# Patient Record
Sex: Female | Born: 1965 | Race: White | Hispanic: No | Marital: Married | Smoking: Never smoker
Health system: Southern US, Community
[De-identification: ages and names within clinical notes are randomized; demographics above are authoritative.]

## PROBLEM LIST (undated history)

## (undated) DIAGNOSIS — B373 Candidiasis of vulva and vagina: Secondary | ICD-10-CM

## (undated) DIAGNOSIS — R9430 Abnormal result of cardiovascular function study, unspecified: Secondary | ICD-10-CM

## (undated) DIAGNOSIS — M545 Low back pain, unspecified: Secondary | ICD-10-CM

## (undated) DIAGNOSIS — Z87440 Personal history of urinary (tract) infections: Secondary | ICD-10-CM

## (undated) DIAGNOSIS — Z8709 Personal history of other diseases of the respiratory system: Secondary | ICD-10-CM

## (undated) DIAGNOSIS — D219 Benign neoplasm of connective and other soft tissue, unspecified: Secondary | ICD-10-CM

## (undated) DIAGNOSIS — I341 Nonrheumatic mitral (valve) prolapse: Secondary | ICD-10-CM

## (undated) DIAGNOSIS — F329 Major depressive disorder, single episode, unspecified: Secondary | ICD-10-CM

## (undated) DIAGNOSIS — IMO0002 Reserved for concepts with insufficient information to code with codable children: Secondary | ICD-10-CM

## (undated) DIAGNOSIS — Z8619 Personal history of other infectious and parasitic diseases: Secondary | ICD-10-CM

## (undated) DIAGNOSIS — J189 Pneumonia, unspecified organism: Secondary | ICD-10-CM

## (undated) DIAGNOSIS — R51 Headache: Secondary | ICD-10-CM

## (undated) DIAGNOSIS — M1990 Unspecified osteoarthritis, unspecified site: Secondary | ICD-10-CM

## (undated) DIAGNOSIS — F32A Depression, unspecified: Secondary | ICD-10-CM

## (undated) DIAGNOSIS — G8929 Other chronic pain: Secondary | ICD-10-CM

## (undated) DIAGNOSIS — J329 Chronic sinusitis, unspecified: Secondary | ICD-10-CM

## (undated) DIAGNOSIS — R42 Dizziness and giddiness: Secondary | ICD-10-CM

## (undated) DIAGNOSIS — Z8669 Personal history of other diseases of the nervous system and sense organs: Secondary | ICD-10-CM

## (undated) DIAGNOSIS — B009 Herpesviral infection, unspecified: Secondary | ICD-10-CM

## (undated) DIAGNOSIS — R0602 Shortness of breath: Secondary | ICD-10-CM

## (undated) DIAGNOSIS — K219 Gastro-esophageal reflux disease without esophagitis: Secondary | ICD-10-CM

## (undated) DIAGNOSIS — M2550 Pain in unspecified joint: Secondary | ICD-10-CM

## (undated) DIAGNOSIS — M419 Scoliosis, unspecified: Secondary | ICD-10-CM

## (undated) DIAGNOSIS — Z8719 Personal history of other diseases of the digestive system: Secondary | ICD-10-CM

## (undated) HISTORY — PX: TOOTH EXTRACTION: SUR596

## (undated) HISTORY — DX: Candidiasis of vulva and vagina: B37.3

## (undated) HISTORY — PX: TONSILLECTOMY: SUR1361

## (undated) HISTORY — DX: Benign neoplasm of connective and other soft tissue, unspecified: D21.9

## (undated) HISTORY — PX: ESOPHAGOGASTRODUODENOSCOPY: SHX1529

## (undated) HISTORY — DX: Nonrheumatic mitral (valve) prolapse: I34.1

## (undated) HISTORY — DX: Personal history of urinary (tract) infections: Z87.440

## (undated) HISTORY — PX: BACK SURGERY: SHX140

## (undated) HISTORY — DX: Reserved for concepts with insufficient information to code with codable children: IMO0002

## (undated) HISTORY — DX: Abnormal result of cardiovascular function study, unspecified: R94.30

## (undated) HISTORY — DX: Headache: R51

## (undated) HISTORY — DX: Herpesviral infection, unspecified: B00.9

## (undated) HISTORY — DX: Scoliosis, unspecified: M41.9

---

## 1989-09-05 HISTORY — PX: TUBAL LIGATION: SHX77

## 1998-04-29 ENCOUNTER — Emergency Department (HOSPITAL_COMMUNITY): Admission: EM | Admit: 1998-04-29 | Discharge: 1998-04-29 | Payer: Self-pay | Admitting: Emergency Medicine

## 1998-06-14 ENCOUNTER — Emergency Department (HOSPITAL_COMMUNITY): Admission: EM | Admit: 1998-06-14 | Discharge: 1998-06-14 | Payer: Self-pay | Admitting: Internal Medicine

## 1998-07-15 ENCOUNTER — Emergency Department (HOSPITAL_COMMUNITY): Admission: EM | Admit: 1998-07-15 | Discharge: 1998-07-15 | Payer: Self-pay | Admitting: Internal Medicine

## 1998-10-01 ENCOUNTER — Emergency Department (HOSPITAL_COMMUNITY): Admission: EM | Admit: 1998-10-01 | Discharge: 1998-10-01 | Payer: Self-pay | Admitting: Emergency Medicine

## 1998-10-03 ENCOUNTER — Emergency Department (HOSPITAL_COMMUNITY): Admission: EM | Admit: 1998-10-03 | Discharge: 1998-10-03 | Payer: Self-pay | Admitting: Emergency Medicine

## 1999-03-15 ENCOUNTER — Encounter: Payer: Self-pay | Admitting: Emergency Medicine

## 1999-03-15 ENCOUNTER — Emergency Department (HOSPITAL_COMMUNITY): Admission: EM | Admit: 1999-03-15 | Discharge: 1999-03-15 | Payer: Self-pay | Admitting: Emergency Medicine

## 1999-07-06 ENCOUNTER — Other Ambulatory Visit: Admission: RE | Admit: 1999-07-06 | Discharge: 1999-07-06 | Payer: Self-pay | Admitting: Obstetrics and Gynecology

## 2000-09-04 ENCOUNTER — Other Ambulatory Visit: Admission: RE | Admit: 2000-09-04 | Discharge: 2000-09-04 | Payer: Self-pay | Admitting: Obstetrics and Gynecology

## 2001-05-01 ENCOUNTER — Emergency Department (HOSPITAL_COMMUNITY): Admission: EM | Admit: 2001-05-01 | Discharge: 2001-05-01 | Payer: Self-pay | Admitting: Emergency Medicine

## 2002-07-17 ENCOUNTER — Other Ambulatory Visit: Admission: RE | Admit: 2002-07-17 | Discharge: 2002-07-17 | Payer: Self-pay | Admitting: Obstetrics and Gynecology

## 2003-08-13 ENCOUNTER — Other Ambulatory Visit: Admission: RE | Admit: 2003-08-13 | Discharge: 2003-08-13 | Payer: Self-pay | Admitting: Obstetrics and Gynecology

## 2010-09-13 ENCOUNTER — Ambulatory Visit (HOSPITAL_COMMUNITY)
Admission: RE | Admit: 2010-09-13 | Discharge: 2010-09-13 | Payer: Self-pay | Source: Home / Self Care | Attending: Gastroenterology | Admitting: Gastroenterology

## 2010-10-06 ENCOUNTER — Other Ambulatory Visit (HOSPITAL_COMMUNITY)
Admission: RE | Admit: 2010-10-06 | Payer: BC Managed Care – HMO | Source: Ambulatory Visit | Admitting: Gastroenterology

## 2010-10-06 ENCOUNTER — Other Ambulatory Visit (HOSPITAL_COMMUNITY)
Admission: RE | Admit: 2010-10-06 | Discharge: 2010-10-06 | Disposition: A | Discharge status: Home / Self Care | Payer: BC Managed Care – HMO | Source: Ambulatory Visit | Attending: Gastroenterology | Admitting: Gastroenterology

## 2010-10-06 ENCOUNTER — Other Ambulatory Visit: Payer: Self-pay | Admitting: Gastroenterology

## 2010-10-06 DIAGNOSIS — B379 Candidiasis, unspecified: Secondary | ICD-10-CM | POA: Insufficient documentation

## 2010-11-05 ENCOUNTER — Other Ambulatory Visit: Payer: Self-pay | Admitting: Orthopedic Surgery

## 2010-11-05 DIAGNOSIS — M542 Cervicalgia: Secondary | ICD-10-CM

## 2010-11-06 ENCOUNTER — Ambulatory Visit
Admission: RE | Admit: 2010-11-06 | Discharge: 2010-11-06 | Disposition: A | Discharge status: Home / Self Care | Payer: BC Managed Care – HMO | Source: Ambulatory Visit | Attending: Orthopedic Surgery | Admitting: Orthopedic Surgery

## 2010-11-06 DIAGNOSIS — M542 Cervicalgia: Secondary | ICD-10-CM

## 2011-02-23 ENCOUNTER — Ambulatory Visit
Admission: RE | Admit: 2011-02-23 | Discharge: 2011-02-23 | Disposition: A | Discharge status: Home / Self Care | Payer: BC Managed Care – HMO | Source: Ambulatory Visit | Attending: Family Medicine | Admitting: Family Medicine

## 2011-02-23 ENCOUNTER — Other Ambulatory Visit: Payer: Self-pay | Admitting: Family Medicine

## 2011-02-23 MED ORDER — IOHEXOL 300 MG/ML  SOLN
100.0000 mL | Freq: Once | INTRAMUSCULAR | Status: AC | PRN
  Administered 2011-02-23: 100 mL via INTRAVENOUS

## 2012-10-12 ENCOUNTER — Other Ambulatory Visit: Payer: Self-pay | Admitting: Obstetrics and Gynecology

## 2012-10-12 ENCOUNTER — Telehealth: Payer: Self-pay | Admitting: Obstetrics and Gynecology

## 2012-10-12 MED ORDER — VALACYCLOVIR HCL 500 MG PO TABS: ORAL_TABLET | ORAL | Status: DC

## 2012-10-12 NOTE — Telephone Encounter (Signed)
Tc to pt regarding msg.  Lm on vm to call back. 

## 2012-10-16 ENCOUNTER — Encounter: Payer: Self-pay | Admitting: Obstetrics and Gynecology

## 2012-10-19 ENCOUNTER — Encounter: Payer: Self-pay | Admitting: Obstetrics and Gynecology

## 2012-10-19 ENCOUNTER — Ambulatory Visit: Payer: PRIVATE HEALTH INSURANCE | Admitting: Obstetrics and Gynecology

## 2012-10-19 VITALS — BP 110/70 | HR 78 | Ht 64.0 in | Wt 175.0 lb

## 2012-10-19 DIAGNOSIS — Z124 Encounter for screening for malignant neoplasm of cervix: Secondary | ICD-10-CM

## 2012-10-19 DIAGNOSIS — Z01419 Encounter for gynecological examination (general) (routine) without abnormal findings: Secondary | ICD-10-CM

## 2012-10-19 MED ORDER — VALACYCLOVIR HCL 500 MG PO TABS: ORAL_TABLET | ORAL | Status: DC

## 2012-10-19 NOTE — Progress Notes (Signed)
Subjective:    Charlotte Castillo is a 47 y.o. female, [id number], who presents for an annual exam. The patient reports more severe herpes outbreak recently, in a different location (upper perineal area, with flu-like symptoms). Recently was treated for bron chitis and sunus infection.  Menstrual cycle:   LMP: Patient's last menstrual period was 09/24/2012.             Review of Systems Pertinent items are noted in HPI. Denies pelvic pain, urinary tract symptoms, vaginitis symptoms, irregular bleeding, menopausal symptoms, change in bowel habits or rectal bleeding   Objective:    BP 110/70  Pulse 78  Ht 5\' 4"  (1.626 m)  Wt 175 lb (79.379 kg)  BMI 30.02 kg/m2  LMP 09/24/2012   Wt Readings from Last 1 Encounters:  10/19/12 175 lb (79.379 kg)   Body mass index is 30.02 kg/(m^2). General Appearance: Alert, no acute distress HEENT: Grossly normal Neck / Thyroid: Supple, no thyromegaly or cervical adenopathy Lungs: Clear to auscultation bilaterally Back: No CVA tenderness Breast Exam: No masses or nodes.No dimpling, nipple retraction or discharge. Cardiovascular: Regular rate and rhythm.  Gastrointestinal: Soft, non-tender, no masses or organomegaly Pelvic Exam: EGBUS-wnl, vagina-normal rugae, cervix- without lesions or tenderness, uterus appears normal size shape and consistency, adnexae-no masses or tenderness Rectovaginal: no masses and normal sphincter tone Lymphatic Exam: Non-palpable nodes in neck, clavicular,  axillary, or inguinal regions  Skin: no rashes or abnormalities Extremities: no clubbing cyanosis or edema  Neurologic: grossly normal Psychiatric: Alert and oriented    Assessment:   Routine GYN Exam HSV -2    Plan:    PAP sent  Valtrex 500 mg #36 1 po qd or bid x 3 days prn 11 refills  RTO 1 year or prn  Danarius Mcconathy,ELMIRAPA-C

## 2012-10-19 NOTE — Progress Notes (Signed)
Regular Periods: no pt have cycle changes; the days Mammogram: yes  Monthly Breast Ex.: yes Exercise: yes  Tetanus < 10 years: yes Seatbelts: yes  NI. Bladder Functn.: yes Abuse at home: no  Daily BM's: yes Stressful Work: yes  Healthy Diet: yes Sigmoid-Colonoscopy: no  Calcium: no Medical problems this year: pt states she had a really bad attack of herpes more than she ever had.   LAST PAP:6/12  Contraception: btl  Mammogram:  2012  PCP: Dr. Clarene Duke  PMH: no change  FMH: no change  Last Bone Scan: no  Pt is married.

## 2012-10-22 LAB — PAP IG W/ RFLX HPV ASCU

## 2012-11-05 NOTE — Telephone Encounter (Signed)
PT had apt with EP on 10/19/2012  Darien Ramus, CMA

## 2013-02-25 ENCOUNTER — Other Ambulatory Visit: Payer: Self-pay | Admitting: Neurosurgery

## 2013-03-01 ENCOUNTER — Encounter (HOSPITAL_COMMUNITY): Payer: Self-pay | Admitting: Pharmacy Technician

## 2013-03-12 ENCOUNTER — Encounter (HOSPITAL_COMMUNITY)
Admission: RE | Admit: 2013-03-12 | Discharge: 2013-03-12 | Disposition: A | Discharge status: Home / Self Care | Payer: PRIVATE HEALTH INSURANCE | Source: Ambulatory Visit | Attending: Neurosurgery | Admitting: Neurosurgery

## 2013-03-12 ENCOUNTER — Encounter (HOSPITAL_COMMUNITY): Payer: Self-pay

## 2013-03-12 HISTORY — DX: Nonrheumatic mitral (valve) prolapse: I34.1

## 2013-03-12 HISTORY — DX: Shortness of breath: R06.02

## 2013-03-12 HISTORY — DX: Depression, unspecified: F32.A

## 2013-03-12 HISTORY — DX: Low back pain, unspecified: G89.29

## 2013-03-12 HISTORY — DX: Pneumonia, unspecified organism: J18.9

## 2013-03-12 HISTORY — DX: Low back pain, unspecified: M54.50

## 2013-03-12 HISTORY — DX: Major depressive disorder, single episode, unspecified: F32.9

## 2013-03-12 HISTORY — DX: Personal history of other diseases of the nervous system and sense organs: Z86.69

## 2013-03-12 HISTORY — DX: Low back pain: M54.5

## 2013-03-12 HISTORY — DX: Personal history of other infectious and parasitic diseases: Z86.19

## 2013-03-12 HISTORY — DX: Personal history of urinary (tract) infections: Z87.440

## 2013-03-12 HISTORY — DX: Dizziness and giddiness: R42

## 2013-03-12 HISTORY — DX: Pain in unspecified joint: M25.50

## 2013-03-12 HISTORY — DX: Chronic sinusitis, unspecified: J32.9

## 2013-03-12 HISTORY — DX: Unspecified osteoarthritis, unspecified site: M19.90

## 2013-03-12 HISTORY — DX: Personal history of other diseases of the respiratory system: Z87.09

## 2013-03-12 HISTORY — DX: Personal history of other diseases of the digestive system: Z87.19

## 2013-03-12 HISTORY — DX: Gastro-esophageal reflux disease without esophagitis: K21.9

## 2013-03-12 LAB — BASIC METABOLIC PANEL
BUN: 13 mg/dL (ref 6–23)
Calcium: 9.3 mg/dL (ref 8.4–10.5)
Chloride: 102 mEq/L (ref 96–112)
Creatinine, Ser: 0.74 mg/dL (ref 0.50–1.10)
GFR calc Af Amer: >90 mL/min (ref >90)
GFR calc non Af Amer: >90 mL/min (ref >90)

## 2013-03-12 LAB — URINE MICROSCOPIC-ADD ON

## 2013-03-12 LAB — URINALYSIS, ROUTINE W REFLEX MICROSCOPIC
Bilirubin Urine: NEGATIVE
Ketones, ur: NEGATIVE mg/dL
Nitrite: NEGATIVE
Specific Gravity, Urine: 1.024 (ref 1.005–1.030)
Urobilinogen, UA: 0.2 mg/dL (ref 0.0–1.0)

## 2013-03-12 LAB — CBC WITH DIFFERENTIAL/PLATELET
Basophils Absolute: 0 10*3/uL (ref 0.0–0.1)
Basophils Relative: 1 % (ref 0–1)
Eosinophils Relative: 2 % (ref 0–5)
HCT: 41 % (ref 36.0–46.0)
MCHC: 34.1 g/dL (ref 30.0–36.0)
Monocytes Absolute: 0.7 10*3/uL (ref 0.1–1.0)
Neutro Abs: 3.1 10*3/uL (ref 1.7–7.7)
RDW: 12.8 % (ref 11.5–15.5)

## 2013-03-12 LAB — PROTIME-INR: INR: 0.97 (ref 0.00–1.49)

## 2013-03-12 NOTE — Progress Notes (Signed)
Pt doesn't have a cardiologist  Echo done early 90's  Denies ever having a stress test/heart cath  Dr.James Little is Medical Md   Denies EKG or CXR in past yr

## 2013-03-12 NOTE — Pre-Procedure Instructions (Signed)
MILLENA CALLINS  03/12/2013   Your procedure is scheduled on:  Tues, July 15 @ 7:30 AM  Report to [facility] at 5:30 AM.  Call this number if you have problems the morning of surgery: [phone]   Remember:   Do not eat food or drink liquids after midnight.   Take these medicines the morning of surgery with A SIP OF WATER: Wellbutrin(Bupropion),Pain Pill(if needed),and Phenergan(Promethazine-if needed)               Stop taking your Diclofenac.No Aspirin,Goody's,BC's,Aleve,Ibuprofen,Fish Oil,or any Herbal Medications   Do not wear jewelry, make-up or nail polish.  Do not wear lotions, powders, or perfumes. You may wear deodorant.  Do not shave 48 hours prior to surgery.   Do not bring valuables to the hospital.  [facility] is not responsible                   for any belongings or valuables.  Contacts, dentures or bridgework may not be worn into surgery.  Leave suitcase in the car. After surgery it may be brought to your room.  For patients admitted to the hospital, checkout time is 11:00 AM the day of  discharge.       Special Instructions: Shower using CHG 2 nights before surgery and the night before surgery.  If you shower the day of surgery use CHG.  Use special wash - you have one bottle of CHG for all showers.  You should use approximately 1/3 of the bottle for each shower.   Please read over the following fact sheets that you were given: Pain Booklet, Coughing and Deep Breathing, MRSA Information and Surgical Site Infection Prevention

## 2013-03-13 LAB — URINE CULTURE

## 2013-03-18 MED ORDER — VANCOMYCIN HCL IN DEXTROSE 1-5 GM/200ML-% IV SOLN
1000.0000 mg | INTRAVENOUS | Status: AC
  Administered 2013-03-19: 1000 mg via INTRAVENOUS
  Filled 2013-03-18: qty 200

## 2013-03-19 ENCOUNTER — Observation Stay (HOSPITAL_COMMUNITY)
Admission: RE | Admit: 2013-03-19 | Discharge: 2013-03-20 | Disposition: A | Discharge status: Home / Self Care | Payer: PRIVATE HEALTH INSURANCE | Source: Ambulatory Visit | Attending: Neurosurgery | Admitting: Neurosurgery

## 2013-03-19 ENCOUNTER — Inpatient Hospital Stay (HOSPITAL_COMMUNITY): Payer: PRIVATE HEALTH INSURANCE

## 2013-03-19 ENCOUNTER — Inpatient Hospital Stay (HOSPITAL_COMMUNITY): Payer: PRIVATE HEALTH INSURANCE | Admitting: Certified Registered"

## 2013-03-19 ENCOUNTER — Encounter (HOSPITAL_COMMUNITY): Payer: Self-pay | Admitting: Certified Registered"

## 2013-03-19 ENCOUNTER — Encounter (HOSPITAL_COMMUNITY): Payer: Self-pay | Admitting: *Deleted

## 2013-03-19 ENCOUNTER — Encounter (HOSPITAL_COMMUNITY)
Admission: RE | Disposition: A | Discharge status: Home / Self Care | Payer: Self-pay | Source: Ambulatory Visit | Attending: Neurosurgery

## 2013-03-19 DIAGNOSIS — M47812 Spondylosis without myelopathy or radiculopathy, cervical region: Secondary | ICD-10-CM | POA: Insufficient documentation

## 2013-03-19 DIAGNOSIS — M5020 Other cervical disc displacement, unspecified cervical region: Principal | ICD-10-CM | POA: Insufficient documentation

## 2013-03-19 DIAGNOSIS — Z01812 Encounter for preprocedural laboratory examination: Secondary | ICD-10-CM | POA: Insufficient documentation

## 2013-03-19 HISTORY — PX: ANTERIOR CERVICAL DECOMP/DISCECTOMY FUSION: SHX1161

## 2013-03-19 SURGERY — ANTERIOR CERVICAL DECOMPRESSION/DISCECTOMY FUSION 3 LEVELS
Anesthesia: General | Site: Spine Cervical | Wound class: Clean

## 2013-03-19 MED ORDER — MENTHOL 3 MG MT LOZG: 1.0000 | LOZENGE | OROMUCOSAL | Status: DC | PRN

## 2013-03-19 MED ORDER — METHOCARBAMOL 500 MG PO TABS
500.0000 mg | ORAL_TABLET | Freq: Four times a day (QID) | ORAL | Status: DC | PRN
  Administered 2013-03-19: 500 mg via ORAL

## 2013-03-19 MED ORDER — MUPIROCIN 2 % EX OINT
1.0000 "application " | TOPICAL_OINTMENT | Freq: Two times a day (BID) | CUTANEOUS | Status: DC
  Administered 2013-03-19: 1 via NASAL
  Filled 2013-03-19: qty 22

## 2013-03-19 MED ORDER — MAGNESIUM HYDROXIDE 400 MG/5ML PO SUSP: 30.0000 mL | Freq: Every day | ORAL | Status: DC | PRN

## 2013-03-19 MED ORDER — BUPROPION HCL ER (SR) 100 MG PO TB12
100.0000 mg | ORAL_TABLET | Freq: Every day | ORAL | Status: DC
  Administered 2013-03-19: 100 mg via ORAL
  Filled 2013-03-19 (×2): qty 1

## 2013-03-19 MED ORDER — LIDOCAINE HCL (CARDIAC) 20 MG/ML IV SOLN
INTRAVENOUS | Status: DC | PRN
  Administered 2013-03-19: 100 mg via INTRAVENOUS

## 2013-03-19 MED ORDER — LIDOCAINE-EPINEPHRINE 1 %-1:100000 IJ SOLN
INTRAMUSCULAR | Status: DC | PRN
  Administered 2013-03-19: 10 mL

## 2013-03-19 MED ORDER — FENTANYL CITRATE 0.05 MG/ML IJ SOLN
INTRAMUSCULAR | Status: DC | PRN
  Administered 2013-03-19: 100 ug via INTRAVENOUS
  Administered 2013-03-19 (×2): 50 ug via INTRAVENOUS
  Administered 2013-03-19: 100 ug via INTRAVENOUS
  Administered 2013-03-19 (×2): 50 ug via INTRAVENOUS

## 2013-03-19 MED ORDER — PROMETHAZINE HCL 25 MG PO TABS: 25.0000 mg | ORAL_TABLET | Freq: Four times a day (QID) | ORAL | Status: DC | PRN

## 2013-03-19 MED ORDER — DOCUSATE SODIUM 100 MG PO CAPS
100.0000 mg | ORAL_CAPSULE | Freq: Two times a day (BID) | ORAL | Status: DC
  Administered 2013-03-19 (×2): 100 mg via ORAL
  Filled 2013-03-19 (×3): qty 1

## 2013-03-19 MED ORDER — ROCURONIUM BROMIDE 100 MG/10ML IV SOLN
INTRAVENOUS | Status: DC | PRN
  Administered 2013-03-19: 35 mg via INTRAVENOUS

## 2013-03-19 MED ORDER — METOCLOPRAMIDE HCL 5 MG/ML IJ SOLN
INTRAMUSCULAR | Status: DC | PRN
  Administered 2013-03-19: 5 mg via INTRAVENOUS

## 2013-03-19 MED ORDER — DEXAMETHASONE SODIUM PHOSPHATE 4 MG/ML IJ SOLN
INTRAMUSCULAR | Status: DC | PRN
  Administered 2013-03-19: 8 mg via INTRAVENOUS

## 2013-03-19 MED ORDER — ACETAMINOPHEN 325 MG PO TABS
650.0000 mg | ORAL_TABLET | ORAL | Status: DC | PRN
  Administered 2013-03-20: 650 mg via ORAL
  Filled 2013-03-19: qty 2

## 2013-03-19 MED ORDER — VECURONIUM BROMIDE 10 MG IV SOLR
INTRAVENOUS | Status: DC | PRN
  Administered 2013-03-19: 2 mg via INTRAVENOUS
  Administered 2013-03-19: 1 mg via INTRAVENOUS
  Administered 2013-03-19: 2 mg via INTRAVENOUS

## 2013-03-19 MED ORDER — LACTATED RINGERS IV SOLN: INTRAVENOUS | Status: DC

## 2013-03-19 MED ORDER — BISACODYL 10 MG RE SUPP: 10.0000 mg | Freq: Every day | RECTAL | Status: DC | PRN

## 2013-03-19 MED ORDER — ARTIFICIAL TEARS OP OINT
TOPICAL_OINTMENT | OPHTHALMIC | Status: DC | PRN
  Administered 2013-03-19: 1 via OPHTHALMIC

## 2013-03-19 MED ORDER — ONDANSETRON HCL 4 MG/2ML IJ SOLN: 4.0000 mg | Freq: Once | INTRAMUSCULAR | Status: DC | PRN

## 2013-03-19 MED ORDER — SODIUM CHLORIDE 0.9 % IV SOLN: 250.0000 mL | INTRAVENOUS | Status: DC

## 2013-03-19 MED ORDER — OXYCODONE-ACETAMINOPHEN 5-325 MG PO TABS: 1.0000 | ORAL_TABLET | ORAL | Status: DC | PRN

## 2013-03-19 MED ORDER — ISOPROPYL ALCOHOL 70 % SOLN
Status: DC | PRN
  Administered 2013-03-19: 1 via TOPICAL

## 2013-03-19 MED ORDER — PHENYLEPHRINE HCL 10 MG/ML IJ SOLN
INTRAMUSCULAR | Status: DC | PRN
  Administered 2013-03-19 (×4): 40 ug via INTRAVENOUS

## 2013-03-19 MED ORDER — TRAMADOL HCL 50 MG PO TABS
50.0000 mg | ORAL_TABLET | Freq: Four times a day (QID) | ORAL | Status: DC | PRN
  Filled 2013-03-19: qty 1

## 2013-03-19 MED ORDER — 0.9 % SODIUM CHLORIDE (POUR BTL) OPTIME
TOPICAL | Status: DC | PRN
  Administered 2013-03-19: 1000 mL

## 2013-03-19 MED ORDER — METHOCARBAMOL 100 MG/ML IJ SOLN
500.0000 mg | Freq: Four times a day (QID) | INTRAVENOUS | Status: DC | PRN
  Filled 2013-03-19: qty 5

## 2013-03-19 MED ORDER — DEXTROSE 5 % IV SOLN
INTRAVENOUS | Status: DC | PRN
  Administered 2013-03-19: 08:00:00 via INTRAVENOUS

## 2013-03-19 MED ORDER — THROMBIN 20000 UNITS EX SOLR
CUTANEOUS | Status: DC | PRN
  Administered 2013-03-19: 09:00:00 via TOPICAL

## 2013-03-19 MED ORDER — METHOCARBAMOL 500 MG PO TABS
ORAL_TABLET | ORAL | Status: AC
  Filled 2013-03-19: qty 1

## 2013-03-19 MED ORDER — ACETAMINOPHEN 650 MG RE SUPP: 650.0000 mg | RECTAL | Status: DC | PRN

## 2013-03-19 MED ORDER — LIDOCAINE HCL 4 % MT SOLN
OROMUCOSAL | Status: DC | PRN
  Administered 2013-03-19: 4 mL via TOPICAL

## 2013-03-19 MED ORDER — SCOPOLAMINE 1 MG/3DAYS TD PT72
MEDICATED_PATCH | TRANSDERMAL | Status: AC
  Administered 2013-03-19: 1 via TRANSDERMAL
  Filled 2013-03-19: qty 1

## 2013-03-19 MED ORDER — SODIUM CHLORIDE 0.9 % IJ SOLN: 3.0000 mL | INTRAMUSCULAR | Status: DC | PRN

## 2013-03-19 MED ORDER — VANCOMYCIN HCL IN DEXTROSE 1-5 GM/200ML-% IV SOLN
1000.0000 mg | Freq: Once | INTRAVENOUS | Status: AC
  Administered 2013-03-19: 1000 mg via INTRAVENOUS
  Filled 2013-03-19: qty 200

## 2013-03-19 MED ORDER — BACITRACIN 50000 UNITS IM SOLR
INTRAMUSCULAR | Status: AC
  Filled 2013-03-19: qty 1

## 2013-03-19 MED ORDER — PROPOFOL 10 MG/ML IV BOLUS
INTRAVENOUS | Status: DC | PRN
  Administered 2013-03-19: 200 mg via INTRAVENOUS

## 2013-03-19 MED ORDER — ALBUMIN HUMAN 5 % IV SOLN
INTRAVENOUS | Status: DC | PRN
  Administered 2013-03-19 (×2): via INTRAVENOUS

## 2013-03-19 MED ORDER — ZOLPIDEM TARTRATE 5 MG PO TABS: 5.0000 mg | ORAL_TABLET | Freq: Every evening | ORAL | Status: DC | PRN

## 2013-03-19 MED ORDER — KETOROLAC TROMETHAMINE 30 MG/ML IJ SOLN
30.0000 mg | Freq: Four times a day (QID) | INTRAMUSCULAR | Status: DC
  Administered 2013-03-19 – 2013-03-20 (×3): 30 mg via INTRAVENOUS
  Filled 2013-03-19 (×4): qty 1

## 2013-03-19 MED ORDER — CYCLOBENZAPRINE HCL 10 MG PO TABS: 10.0000 mg | ORAL_TABLET | Freq: Three times a day (TID) | ORAL | Status: DC | PRN

## 2013-03-19 MED ORDER — PROMETHAZINE HCL 25 MG PO TABS: 12.5000 mg | ORAL_TABLET | ORAL | Status: DC | PRN

## 2013-03-19 MED ORDER — NEOSTIGMINE METHYLSULFATE 1 MG/ML IJ SOLN
INTRAMUSCULAR | Status: DC | PRN
  Administered 2013-03-19: 4 mg via INTRAVENOUS

## 2013-03-19 MED ORDER — VALACYCLOVIR HCL 500 MG PO TABS
500.0000 mg | ORAL_TABLET | Freq: Two times a day (BID) | ORAL | Status: DC
  Administered 2013-03-19 (×2): 500 mg via ORAL
  Filled 2013-03-19 (×4): qty 1

## 2013-03-19 MED ORDER — SODIUM CHLORIDE 0.9 % IJ SOLN
3.0000 mL | Freq: Two times a day (BID) | INTRAMUSCULAR | Status: DC
  Administered 2013-03-19: 3 mL via INTRAVENOUS

## 2013-03-19 MED ORDER — LACTATED RINGERS IV SOLN
INTRAVENOUS | Status: DC | PRN
  Administered 2013-03-19 (×2): via INTRAVENOUS

## 2013-03-19 MED ORDER — MIDAZOLAM HCL 5 MG/5ML IJ SOLN
INTRAMUSCULAR | Status: DC | PRN
  Administered 2013-03-19 (×2): 1 mg via INTRAVENOUS

## 2013-03-19 MED ORDER — HYDROCODONE-ACETAMINOPHEN 5-325 MG PO TABS: 1.0000 | ORAL_TABLET | ORAL | Status: DC | PRN

## 2013-03-19 MED ORDER — SODIUM CHLORIDE 0.9 % IV SOLN
INTRAVENOUS | Status: AC
  Filled 2013-03-19: qty 500

## 2013-03-19 MED ORDER — PROMETHAZINE HCL 25 MG/ML IJ SOLN: 12.5000 mg | INTRAMUSCULAR | Status: DC | PRN

## 2013-03-19 MED ORDER — PHENOL 1.4 % MT LIQD: 1.0000 | OROMUCOSAL | Status: DC | PRN

## 2013-03-19 MED ORDER — ONDANSETRON HCL 4 MG/2ML IJ SOLN
INTRAMUSCULAR | Status: DC | PRN
  Administered 2013-03-19 (×2): 4 mg via INTRAVENOUS

## 2013-03-19 MED ORDER — ONDANSETRON HCL 4 MG/2ML IJ SOLN: 4.0000 mg | INTRAMUSCULAR | Status: DC | PRN

## 2013-03-19 MED ORDER — HYDROMORPHONE HCL PF 1 MG/ML IJ SOLN
INTRAMUSCULAR | Status: AC
  Filled 2013-03-19: qty 1

## 2013-03-19 MED ORDER — GLYCOPYRROLATE 0.2 MG/ML IJ SOLN
INTRAMUSCULAR | Status: DC | PRN
  Administered 2013-03-19: 0.6 mg via INTRAVENOUS

## 2013-03-19 MED ORDER — SODIUM CHLORIDE 0.9 % IR SOLN
Status: DC | PRN
  Administered 2013-03-19: 09:00:00

## 2013-03-19 MED ORDER — MORPHINE SULFATE 2 MG/ML IJ SOLN: 1.0000 mg | INTRAMUSCULAR | Status: DC | PRN

## 2013-03-19 MED ORDER — CHLORHEXIDINE GLUCONATE CLOTH 2 % EX PADS
6.0000 | MEDICATED_PAD | Freq: Every day | CUTANEOUS | Status: DC
  Administered 2013-03-20: 6 via TOPICAL

## 2013-03-19 MED ORDER — HYDROMORPHONE HCL PF 1 MG/ML IJ SOLN
0.2500 mg | INTRAMUSCULAR | Status: DC | PRN
  Administered 2013-03-19 (×2): 0.5 mg via INTRAVENOUS

## 2013-03-19 SURGICAL SUPPLY — 54 items
APL SKNCLS STERI-STRIP NONHPOA (GAUZE/BANDAGES/DRESSINGS) ×1
BAG DECANTER FOR FLEXI CONT (MISCELLANEOUS) ×2 IMPLANT
BANDAGE GAUZE ELAST BULKY 4 IN (GAUZE/BANDAGES/DRESSINGS) ×4 IMPLANT
BENZOIN TINCTURE PRP APPL 2/3 (GAUZE/BANDAGES/DRESSINGS) ×2 IMPLANT
BIT DRILL 2.3 12 FIXED (INSTRUMENTS) IMPLANT
BUR MATCHSTICK NEURO 3.0 LAGG (BURR) ×2 IMPLANT
CANISTER SUCTION 2500CC (MISCELLANEOUS) ×2 IMPLANT
CLOTH BEACON ORANGE TIMEOUT ST (SAFETY) ×2 IMPLANT
CONT SPEC 4OZ CLIKSEAL STRL BL (MISCELLANEOUS) ×2 IMPLANT
DRAPE LAPAROTOMY 100X72 PEDS (DRAPES) ×2 IMPLANT
DRAPE MICROSCOPE LEICA (MISCELLANEOUS) ×2 IMPLANT
DRAPE POUCH INSTRU U-SHP 10X18 (DRAPES) ×2 IMPLANT
DRILL 12MM (INSTRUMENTS) ×2
DURAPREP 6ML APPLICATOR 50/CS (WOUND CARE) ×2 IMPLANT
ELECT REM PT RETURN 9FT ADLT (ELECTROSURGICAL) ×2
ELECTRODE REM PT RTRN 9FT ADLT (ELECTROSURGICAL) ×1 IMPLANT
GAUZE SPONGE 4X4 16PLY XRAY LF (GAUZE/BANDAGES/DRESSINGS) IMPLANT
GLOVE BIOGEL PI IND STRL 7.5 (GLOVE) IMPLANT
GLOVE BIOGEL PI INDICATOR 7.5 (GLOVE) ×2
GLOVE ECLIPSE 7.5 STRL STRAW (GLOVE) ×2 IMPLANT
GLOVE EXAM NITRILE LRG STRL (GLOVE) IMPLANT
GLOVE EXAM NITRILE MD LF STRL (GLOVE) IMPLANT
GLOVE EXAM NITRILE XL STR (GLOVE) IMPLANT
GLOVE EXAM NITRILE XS STR PU (GLOVE) IMPLANT
GLOVE SURG SS PI 7.0 STRL IVOR (GLOVE) ×4 IMPLANT
GOWN BRE IMP SLV AUR LG STRL (GOWN DISPOSABLE) ×1 IMPLANT
GOWN BRE IMP SLV AUR XL STRL (GOWN DISPOSABLE) ×1 IMPLANT
GOWN STRL REIN 2XL LVL4 (GOWN DISPOSABLE) IMPLANT
HEAD HALTER (SOFTGOODS) ×2 IMPLANT
KIT BASIN OR (CUSTOM PROCEDURE TRAY) ×2 IMPLANT
KIT ROOM TURNOVER OR (KITS) ×2 IMPLANT
NDL HYPO 25X1 1.5 SAFETY (NEEDLE) ×1 IMPLANT
NDL SPNL 20GX3.5 QUINCKE YW (NEEDLE) ×1 IMPLANT
NEEDLE HYPO 22GX1.5 SAFETY (NEEDLE) ×3 IMPLANT
NEEDLE HYPO 25X1 1.5 SAFETY (NEEDLE) ×2 IMPLANT
NEEDLE SPNL 20GX3.5 QUINCKE YW (NEEDLE) ×4 IMPLANT
NS IRRIG 1000ML POUR BTL (IV SOLUTION) ×2 IMPLANT
PACK LAMINECTOMY NEURO (CUSTOM PROCEDURE TRAY) ×2 IMPLANT
PATTIES SURGICAL .75X.75 (GAUZE/BANDAGES/DRESSINGS) ×2 IMPLANT
PIN DISTRACTION 14MM (PIN) ×4 IMPLANT
PLATE 48MM (Plate) ×1 IMPLANT
RUBBERBAND STERILE (MISCELLANEOUS) ×4 IMPLANT
SCREW 12MM (Screw) ×8 IMPLANT
SPACER ASSEM CERV LORD 6M (Spacer) ×3 IMPLANT
SPONGE GAUZE 4X4 12PLY (GAUZE/BANDAGES/DRESSINGS) ×2 IMPLANT
SPONGE INTESTINAL PEANUT (DISPOSABLE) ×2 IMPLANT
STRIP CLOSURE SKIN 1/2X4 (GAUZE/BANDAGES/DRESSINGS) ×2 IMPLANT
SUT VIC AB 3-0 SH 8-18 (SUTURE) ×3 IMPLANT
SYR 20ML ECCENTRIC (SYRINGE) ×2 IMPLANT
TAPE CLOTH SURG 4X10 WHT LF (GAUZE/BANDAGES/DRESSINGS) ×1 IMPLANT
TOWEL OR 17X24 6PK STRL BLUE (TOWEL DISPOSABLE) ×2 IMPLANT
TOWEL OR 17X26 10 PK STRL BLUE (TOWEL DISPOSABLE) ×2 IMPLANT
TRAY FOLEY CATH 14FRSI W/METER (CATHETERS) ×1 IMPLANT
WATER STERILE IRR 1000ML POUR (IV SOLUTION) ×2 IMPLANT

## 2013-03-19 NOTE — Anesthesia Procedure Notes (Signed)
Procedure Name: Intubation Date/Time: 03/19/2013 7:56 AM Performed by: Margaree Mackintosh Pre-anesthesia Checklist: Patient identified, Timeout performed, Emergency Drugs available, Suction available and Patient being monitored Patient Re-evaluated:Patient Re-evaluated prior to inductionOxygen Delivery Method: Circle system utilized Preoxygenation: Pre-oxygenation with 100% oxygen Intubation Type: IV induction Ventilation: Mask ventilation without difficulty and Oral airway inserted - appropriate to patient size Laryngoscope Size: Mac and 3 Grade View: Grade II Tube type: Oral Tube size: 7.0 mm Number of attempts: 1 Airway Equipment and Method: Stylet and LTA kit utilized Placement Confirmation: ETT inserted through vocal cords under direct vision,  positive ETCO2 and breath sounds checked- equal and bilateral Secured at: 22 cm Tube secured with: Tape Dental Injury: Teeth and Oropharynx as per pre-operative assessment

## 2013-03-19 NOTE — Progress Notes (Signed)
ANTIBIOTIC CONSULT NOTE - INITIAL  Pharmacy Consult for Vancomycin Indication: Surgical prophylaxis.  Allergies  Allergen Reactions  . Codeine     Nausea   . Demerol (Meperidine)     unknown  . Penicillins     Rash, nausea   . Tetracyclines & Related     Rash, nausea and vomiting  . Other     Yogurt-vomiting  . Prozac (Fluoxetine Hcl)     Sick     Patient Measurements:   Weight = 80.6 kg  Vital Signs: Temp: 99 F (37.2 C) (07/15 1240) Temp src: Oral (07/15 0624) BP: 128/65 mmHg (07/15 1240) Pulse Rate: 93 (07/15 1240) Intake/Output from previous day:   Intake/Output from this shift: Total I/O In: 2440 [P.O.:240; I.V.:1700; IV Piggyback:500] Out: 365 [Urine:215; Blood:150]  Labs: No results found for this basename: WBC, HGB, PLT, LABCREA, CREATININE,  in the last 72 hours The CrCl is unknown because both a height and weight (above a minimum accepted value) are required for this calculation. No results found for this basename: VANCOTROUGH, Leodis Binet, VANCORANDOM, GENTTROUGH, GENTPEAK, GENTRANDOM, TOBRATROUGH, TOBRAPEAK, TOBRARND, AMIKACINPEAK, AMIKACINTROU, AMIKACIN,  in the last 72 hours   Microbiology: Recent Results (from the past 720 hour(s))  SURGICAL PCR SCREEN     Status: Abnormal   Collection Time    03/12/13  8:57 AM      Result Value Range Status   MRSA, PCR POSITIVE (*) NEGATIVE Final   Staphylococcus aureus POSITIVE (*) NEGATIVE Final   Comment:            The Xpert SA Assay (FDA     approved for NASAL specimens     in patients over 47 years of age),     is one component of     a comprehensive surveillance     program.  Test performance has     been validated by [employer] for patients greater     than or equal to 47 year old.     It is not intended     to diagnose infection nor to     guide or monitor treatment.  URINE CULTURE     Status: None   Collection Time    03/12/13  8:58 AM      Result Value Range Status   Specimen  Description URINE, CLEAN CATCH   Final   Special Requests NONE   Final   Culture  Setup Time 03/12/2013 10:19   Final   Colony Count NO GROWTH   Final   Culture NO GROWTH   Final   Report Status 03/13/2013 FINAL   Final    Medical History: Past Medical History  Diagnosis Date  . Abnormal Pap smear     ACUS   . Fibroid   . HSV infection     takes Valtrex prn  . Vaginal yeast infection     h/o  . Headache(784.0)   . Mitral valve prolapse   . Hx of bladder infections   . Scoliosis   . H/O hiatal hernia   . MVP (mitral valve prolapse)     no issues   . Shortness of breath     occasionally and more with exertion  . Pneumonia     hx of;last time about 20yrs ago  . Sinus infection     hx of;early in 2014  . History of migraine     last one about 3months ago  . Dizziness  occasionally  . Arthritis   . Joint pain   . Chronic low back pain   . GERD (gastroesophageal reflux disease)     no meds required  . History of bronchitis     early 2014  . History of bladder infections     last time about a yr ago  . Depression     takes Wellbutrin daily  . History of shingles     a yr ago    Assessment: 47 YOF s/p anterior cervical decompression/discectomy fusion. Pharmacy is consulted to dose vancomycin for surgical prophylaxis d/t pt. Has penicillin allergy. 1 dose of vancomycin 1000 ng was given ~ 0800 this am. No drain in place per RN. Renal function good. Scr 0.74 with est. crcl > 90 ml/min.  Goal of Therapy:  Prevent post-op infections  Plan:  - Vancomycin 1000 mg IV x 1 at 2000 - Pharmacy sign off  Please reconsult if need help for other antibiotics dosing.   Bayard Hugger, PharmD, BCPS  Clinical Pharmacist  Pager: [phone]   03/19/2013,2:19 PM

## 2013-03-19 NOTE — Preoperative (Signed)
Beta Blockers   Reason not to administer Beta Blockers:Not Applicable 

## 2013-03-19 NOTE — Transfer of Care (Signed)
Immediate Anesthesia Transfer of Care Note  Patient: Charlotte Castillo  Procedure(s) Performed: Procedure(s) with comments: ANTERIOR CERVICAL DECOMPRESSION/DISCECTOMY FUSION 3 LEVELS (N/A) - Cervical Five-Six,Cervical Six-Seven,Cervical Seven-Thoracic One  Anterior cervical decompression/diskectomy/fusion/allograft/trestle plate  Patient Location: PACU  Anesthesia Type:General  Level of Consciousness: awake, alert  and oriented  Airway & Oxygen Therapy: Patient Spontanous Breathing and Patient connected to nasal cannula oxygen  Post-op Assessment: Report given to PACU RN, Post -op Vital signs reviewed and stable and Patient moving all extremities X 4  Post vital signs: Reviewed and stable  Complications: No apparent anesthesia complications

## 2013-03-19 NOTE — Anesthesia Postprocedure Evaluation (Signed)
  Anesthesia Post-op Note  Patient: Charlotte Castillo  Procedure(s) Performed: Procedure(s) with comments: ANTERIOR CERVICAL DECOMPRESSION/DISCECTOMY FUSION 3 LEVELS (N/A) - Cervical Five-Six,Cervical Six-Seven,Cervical Seven-Thoracic One  Anterior cervical decompression/diskectomy/fusion/allograft/trestle plate  Patient Location: PACU  Anesthesia Type:General  Level of Consciousness: awake, oriented, sedated and patient cooperative  Airway and Oxygen Therapy: Patient Spontanous Breathing  Post-op Pain: mild  Post-op Assessment: Post-op Vital signs reviewed, Patient's Cardiovascular Status Stable, Respiratory Function Stable, Patent Airway, No signs of Nausea or vomiting and Pain level controlled  Post-op Vital Signs: stable  Complications: No apparent anesthesia complications

## 2013-03-19 NOTE — Anesthesia Preprocedure Evaluation (Signed)
Anesthesia Evaluation  Patient identified by MRN, date of birth, ID band Patient awake    Reviewed: Allergy & Precautions, H&P , NPO status , Patient's Chart, lab work & pertinent test results  Airway       Dental   Pulmonary          Cardiovascular + Valvular Problems/Murmurs     Neuro/Psych  Headaches, Depression    GI/Hepatic hiatal hernia, GERD-  ,  Endo/Other    Renal/GU      Musculoskeletal   Abdominal   Peds  Hematology   Anesthesia Other Findings   Reproductive/Obstetrics                           Anesthesia Physical Anesthesia Plan  ASA: II  Anesthesia Plan: General   Post-op Pain Management:    Induction: Intravenous  Airway Management Planned: Oral ETT  Additional Equipment:   Intra-op Plan:   Post-operative Plan: Extubation in OR  Informed Consent: I have reviewed the patients History and Physical, chart, labs and discussed the procedure including the risks, benefits and alternatives for the proposed anesthesia with the patient or authorized representative who has indicated his/her understanding and acceptance.     Plan Discussed with: CRNA, Anesthesiologist and Surgeon  Anesthesia Plan Comments:         Anesthesia Quick Evaluation

## 2013-03-19 NOTE — Op Note (Signed)
03/19/2013  11:12 AM  PATIENT:  Charlotte Castillo  47 y.o. female  PRE-OPERATIVE DIAGNOSIS:  Cervical herniated disc, Cervical spondylosis, radiculopathy , C5-6, C6-7 , C7T1  POST-OPERATIVE DIAGNOSIS:  Cervical Herniated Disc,Cervical Spondylosis,Radiculopathy, C5-6, C6-7 , C7T1  PROCEDURE:  Procedure(s): ANTERIOR CERVICAL DECOMPRESSION/DISCECTOMY FUSION 3 LEVELS ,  C5-6, C6-7 , C7T1 , stuctural allograft, trestle plate  SURGEON:  Surgeon(s): Clydene Fake, MD  ASSISTANTS:kritzer  ANESTHESIA:   general  EBL:  Total I/O In: 1700 [I.V.:1200; IV Piggyback:500] Out: 325 [Urine:175; Blood:150]  BLOOD ADMINISTERED:none  DRAINS: none   SPECIMEN:  No Specimen  DICTATION: Patient with neck and arm pain bilaterally worse to the right. She did spinal change she will spine 1 was done showing the spinal change 56 and 6 and 71 number disc extrusion to the right side and after much discussion was decided to proceed with 3 level anterior cervical discectomy and fusion 56,67 and 71.  Patient brought into operating room general anesthesia induced patient placed in 10 pounds halter traction prepped draped sterile fashion segments inject with 10 cc 1% lidocaine with epinephrine incision was then made from the midline to the border of sternocleidomastoid muscle the left-sided neck incision taken of the platysma hemostasis obtained with Bovie cauterization platysma was incised and blunt dissection taken to the intracerebral fascia to the anterior cervical spine. Needles were placed the 2 interspaces and x-rays obtained showing needles were at the 56 and 6 levels. As the needle was removed the spaces were incised and partial discectomy was done we then reflected longus Richardson Dopp he laterally using the Bovie incised the space again 566 and 71 placed obtaining retractor sure to her levels at 56 and 67 distraction pins were placed in C5 and C7. Microscope was brought in for microdissection this point.  Discectomy  was then continued to with a curettes and pituitary rongeurs and 1 and 2 mm Kerrison punches were used to remove posterior disc osteophyte and ligament decompressed the central canal and performing bilateral foraminotomies at both of 56 and 67 levels appeared were finished we did decompression central canal and bilateral foramina. High-speed drill was used to remove callus endplate we measured height a displaced to be 6 mm the 6 mm structural allograft bone was tapped in place and it's spot. We had good hemostasis we. About solution throughout. The distraction was removed the distraction pin removed from C5 the retractors removed down suite and see the C7-T1 level we placed distraction pins T1 and and then placed the distractor and distracted to 71 space. Discectomy then continued in the same fashion as the prior levels and 1 and 2 mm Kerrison punches were used to remove posterior disc osteophyte and ligament decompressed the central canal and bilateral foraminotomies were performed decompressing the nerve roots bilaterally. We were finished we did decompression high-speed drill was used to remove callus endplate measured the space to be 6 mm and a 6 mm structural allograft bone was tapped in place. Distraction distraction pins were removedobtained with Gelfoam thrombin we irrigated with about solution we did get position of the bone in all 3 spaces. A Tressel anterior cervical plate was placed with anterior surgical spine and 2 screws placed in C5 C6-C7 and T1 these were final tightened lateral x-rays obtained showing good position plate-screw system bone plugs at all levels C5-6 677 retractors removed we urine, solution had very good hemostasis platysma was closed through Vicryl interrupted sutures subcutaneous tissue closed the same skin closed benzoin Steri-Strips dressing was  placed patient saucer color woken from anesthesia and transferred to recovery  PLAN OF CARE: Admit for overnight observation  PATIENT  DISPOSITION:  PACU - hemodynamically stable.

## 2013-03-19 NOTE — H&P (Signed)
See H& P.

## 2013-03-19 NOTE — Interval H&P Note (Signed)
History and Physical Interval Note:  03/19/2013 7:45 AM  Charlotte Castillo  has presented today for surgery, with the diagnosis of Cervical herniated disc, Cervical spondylosis, radiculopathy  The various methods of treatment have been discussed with the patient and family. After consideration of risks, benefits and other options for treatment, the patient has consented to  Procedure(s) with comments: ANTERIOR CERVICAL DECOMPRESSION/DISCECTOMY FUSION 3 LEVELS (N/A) - C5-6 C6-7 C7-T1 Anterior cervical decompression/diskectomy/fusion/allograft/trestle plate as a surgical intervention .  The patient's history has been reviewed, patient examined, no change in status, stable for surgery.  I have reviewed the patient's chart and labs.  Questions were answered to the patient's satisfaction.     [name]

## 2013-03-20 MED ORDER — CYCLOBENZAPRINE HCL 10 MG PO TABS: 10.0000 mg | ORAL_TABLET | Freq: Three times a day (TID) | ORAL | Status: DC | PRN

## 2013-03-20 MED ORDER — HYDROCODONE-ACETAMINOPHEN 5-325 MG PO TABS: 1.0000 | ORAL_TABLET | ORAL | Status: DC | PRN

## 2013-03-20 NOTE — Discharge Summary (Signed)
Physician Discharge Summary  Patient ID: Charlotte Castillo MRN: [id number] DOB/AGE: [birth year] 47 y.o.  Admit date: 03/19/2013 Discharge date: 03/20/2013  Admission Diagnoses:Cervical herniated disc, Cervical spondylosis, radiculopathy , C5-6, C6-7 , C7T1      Discharge Diagnoses: Cervical herniated disc, Cervical spondylosis, radiculopathy , C5-6, C6-7 , C7T1     Active Problems:   * No active hospital problems. *   Discharged Condition: good  Hospital Course: pt admitted on day of surgery  - underwent procedure below - pt doing well, no pain, no numbness  - swallowing, voiding, ambulating well - voice good  Consults: None    Treatments: surgery: PROCEDURE: Procedure(s):  ANTERIOR CERVICAL DECOMPRESSION/DISCECTOMY FUSION 3 LEVELS , C5-6, C6-7 , C7T1 , stuctural allograft, trestle plate      Discharge Exam: Blood pressure 110/74, pulse 87, temperature 98.4 F (36.9 C), temperature source Oral, resp. rate 18, last menstrual period 02/22/2013, SpO2 94.00%. Wound:c/d/i  Disposition: home     Medication List    STOP taking these medications       diclofenac 75 MG EC tablet  Commonly known as:  VOLTAREN      TAKE these medications       cyclobenzaprine 10 MG tablet  Commonly known as:  FLEXERIL  Take 1 tablet (10 mg total) by mouth 3 (three) times daily as needed for muscle spasms.     HYDROcodone-acetaminophen 7.5-325 MG per tablet  Commonly known as:  NORCO  Take 1 tablet by mouth every 6 (six) hours as needed for pain.     HYDROcodone-acetaminophen 5-325 MG per tablet  Commonly known as:  NORCO/VICODIN  Take 1-2 tablets by mouth every 4 (four) hours as needed.     ONE-A-DAY WOMENS PO  Take by mouth.     OSTEO BI-FLEX TRIPLE STRENGTH PO  Take 1 tablet by mouth daily.     promethazine 25 MG tablet  Commonly known as:  PHENERGAN  Take 25 mg by mouth every 6 (six) hours as needed for nausea.     valACYclovir 500 MG tablet  Commonly known as:  VALTREX   Take 1 tablet po BID x 3 days for an outbreak then 1 po Qday for suppression     WELLBUTRIN SR 100 MG 12 hr tablet  Generic drug:  buPROPion  Take 100 mg by mouth daily.         SignedClydene Fake, MD 03/20/2013, 8:43 AM

## 2013-03-20 NOTE — Progress Notes (Signed)
Pt given D/C instructions with Rx's, verbal understanding given. Pt D/C'd home via wheelchair @ 0940 per MD order. Rema Fendt, RN

## 2013-03-21 ENCOUNTER — Encounter (HOSPITAL_COMMUNITY): Payer: Self-pay | Admitting: Neurosurgery

## 2013-11-22 ENCOUNTER — Other Ambulatory Visit (HOSPITAL_COMMUNITY): Payer: PRIVATE HEALTH INSURANCE

## 2014-01-15 ENCOUNTER — Encounter: Payer: Self-pay | Admitting: Dietician

## 2014-01-15 ENCOUNTER — Encounter: Payer: PRIVATE HEALTH INSURANCE | Attending: Family Medicine | Admitting: Dietician

## 2014-01-15 VITALS — Ht 64.0 in | Wt 178.1 lb

## 2014-01-15 DIAGNOSIS — Z713 Dietary counseling and surveillance: Secondary | ICD-10-CM | POA: Insufficient documentation

## 2014-01-15 DIAGNOSIS — Z6830 Body mass index (BMI) 30.0-30.9, adult: Secondary | ICD-10-CM | POA: Insufficient documentation

## 2014-01-15 DIAGNOSIS — R110 Nausea: Secondary | ICD-10-CM | POA: Insufficient documentation

## 2014-01-15 DIAGNOSIS — K449 Diaphragmatic hernia without obstruction or gangrene: Secondary | ICD-10-CM | POA: Insufficient documentation

## 2014-01-15 DIAGNOSIS — E7800 Pure hypercholesterolemia, unspecified: Secondary | ICD-10-CM | POA: Insufficient documentation

## 2014-01-15 DIAGNOSIS — E669 Obesity, unspecified: Secondary | ICD-10-CM | POA: Insufficient documentation

## 2014-01-15 NOTE — Progress Notes (Signed)
  Medical Nutrition Therapy:  Appt start time: 3524 end time:  0955.   Assessment:  Primary concerns today: Charlotte Castillo is here today for weight management and reduce cholesterol. States she has not been able to exercise lately since she is busy and has a long commute. Does not eat a lot of sweets and has been trying to eat more salads and reduce bread portion since her doctor's visits. Also has a hx of hiatal hernia and has some nausea. States that the doctor wants you to lose 20 lbs. Has been at the weight for about 6-7 years though it has fluctuated a bit (167-182 lbs). Lost a few lbs since doctor's visits.   States she works in [employer] in [place] and "sits all day long" and lives with husband and though states she does most of the cooking. Eats lunch out most days and eats breakfast out 1 x on the weekend.   Total cholesterol was 218 and LDL was 148. Was on Crestor for 3 weeks but had some side effects. Taking krill oil 2 x day.   Preferred Learning Style:   No preference indicated   Learning Readiness:   Ready  MEDICATIONS: see list   DIETARY INTAKE:  Avoided foods include: liver, asparagus, spinach  24-hr recall:  B ( AM): when gets to work, packet of flavored oatmeal with hot tea and splenda  Snk ( AM): peanut flavored [employer] bar or cup of peaches in water  L ( PM): varies, lately bacon ranch chicken salad from [employer] or grilled chicken snack wraps from [employer] with sweet tea  Snk ( PM): not very often D ( PM): grilled chicken or pork chops or steaks with pasta/rice and vegetable, spaghetti 2 x month with 16 oz glass 2% milk Snk ( PM): not often, though recently has been have orange sherbet push up pops Beverages: hot tea, 20 oz cup of sweet tea, at least 16 2% milk or 16 oz juice  Usual physical activity: none, though takes the stairs at work  Estimated energy needs: 1800 calories 200 g carbohydrates 135 g protein 50 g fat  Progress Towards Goal(s):  In  progress.   Nutritional Diagnosis:  Charlo-3.3 Overweight/obesity As related to hx of excess consumption of sugar sweetened beverages and lack of physical activity.  As evidenced by BMI of 30.6 and elevated cholesterol levels.    Intervention:  Nutrition counseling provided. Plan: Aim to get 30 minutes of physical activity per day (start with 15 minutes at lunch and increase). Fill up half of your plate with vegetables at lunch and dinner. Limit starch to a quarter of your plate. Limit saturated fats and avoid trans fats (partially hydrogenated oils). Switch from sweet tea to unsweet tea with splenda. Cut back on juice to about 4 oz of day and think about diluted it with water or club soda (bubbles).   Teaching Method Utilized:  Visual Auditory Hands on  Handouts given during visit include:  MyPlate Handout  15 g CHO Snacks  Barriers to learning/adherence to lifestyle change: none  Demonstrated degree of understanding via:  Teach Back   Monitoring/Evaluation:  Dietary intake, exercise, and body weight in 3 month(s).

## 2014-01-15 NOTE — Patient Instructions (Signed)
Aim to get 30 minutes of physical activity per day (start with 15 minutes at lunch and increase). Fill up half of your plate with vegetables at lunch and dinner. Limit starch to a quarter of your plate. Limit saturated fats and avoid trans fats (partially hydrogenated oils). Switch from sweet tea to unsweet tea with splenda. Cut back on juice to about 4 oz of day and think about diluted it with water or club soda (bubbles).

## 2014-02-19 ENCOUNTER — Encounter: Payer: Self-pay | Admitting: Cardiology

## 2014-02-19 ENCOUNTER — Ambulatory Visit (INDEPENDENT_AMBULATORY_CARE_PROVIDER_SITE_OTHER): Payer: PRIVATE HEALTH INSURANCE | Admitting: Cardiology

## 2014-02-19 VITALS — BP 110/66 | HR 93 | Ht 63.5 in | Wt 178.0 lb

## 2014-02-19 DIAGNOSIS — R9431 Abnormal electrocardiogram [ECG] [EKG]: Secondary | ICD-10-CM | POA: Insufficient documentation

## 2014-02-19 DIAGNOSIS — R0602 Shortness of breath: Secondary | ICD-10-CM

## 2014-02-19 DIAGNOSIS — E785 Hyperlipidemia, unspecified: Secondary | ICD-10-CM | POA: Insufficient documentation

## 2014-02-19 NOTE — Progress Notes (Signed)
Patient ID: Charlotte Castillo, female   DOB: [birth year], 48 y.o.   MRN: [id number]    HPI  The patient is referred today for cardiology evaluation. Prior to her visit today we'll try to obtain more information. Unfortunately we are not aware that she had had testing through [facility] [place]. We will obtain this information and then I will be in touch with her about further thoughts. The patient had some shortness of breath. It is my understanding that she had a stress test showing no marked abnormalities. She also had an echo and a 48 hour monitor. She does have a an abnormal EKG. Crestor made her feel "foggy". She has not been having any significant chest pain.  Allergies  Allergen Reactions  . Codeine     Nausea   . Demerol [Meperidine]     unknown  . Penicillins     Rash, nausea   . Tetracyclines & Related     Rash, nausea and vomiting  . Other     Yogurt-vomiting  . Prozac [Fluoxetine Hcl]     Sick     Current Outpatient Prescriptions  Medication Sig Dispense Refill  . acetaminophen (TYLENOL) 500 MG tablet Take 500 mg by mouth every 6 (six) hours as needed.      Marland Kitchen aspirin 81 MG chewable tablet Chew by mouth daily.      Marland Kitchen buPROPion (WELLBUTRIN SR) 100 MG 12 hr tablet Take 100 mg by mouth daily.       . calcium-vitamin D (OSCAL) 250-125 MG-UNIT per tablet Take 1 tablet by mouth daily.      Marland Kitchen loratadine (CLARITIN) 10 MG tablet Take 10 mg by mouth daily.      . Misc Natural Products (OSTEO BI-FLEX TRIPLE STRENGTH PO) Take 1 tablet by mouth daily.       . Multiple Vitamins-Calcium (ONE-A-DAY WOMENS PO) Take by mouth.      . Omega-3 Krill Oil 500 MG CAPS Take by mouth.      . promethazine (PHENERGAN) 25 MG tablet Take 25 mg by mouth every 6 (six) hours as needed for nausea.       No current facility-administered medications for this visit.    History   Social History  . Marital Status: Married    Spouse Name: N/A    Number of Children: N/A  . Years of  Education: N/A   Occupational History  . Not on file.   Social History Main Topics  . Smoking status: Never Smoker   . Smokeless tobacco: Never Used  . Alcohol Use: Yes     Comment: social  . Drug Use: No  . Sexual Activity: Yes    Birth Control/ Protection: Surgical     Comment: BTL    Other Topics Concern  . Not on file   Social History Narrative  . No narrative on file    Family History  Problem Relation Age of Onset  . Adopted: Yes    Past Medical History  Diagnosis Date  . Abnormal Pap smear     ACUS   . Fibroid   . HSV infection     takes Valtrex prn  . Vaginal yeast infection     h/o  . Headache(784.0)   . Mitral valve prolapse   . Hx of bladder infections   . Scoliosis   . H/O hiatal hernia   . MVP (mitral valve prolapse)     no issues   . Shortness of breath  occasionally and more with exertion  . Pneumonia     hx of;last time about 19yrs ago  . Sinus infection     hx of;early in 2014  . History of migraine     last one about 45months ago  . Dizziness     occasionally  . Arthritis   . Joint pain   . Chronic low back pain   . GERD (gastroesophageal reflux disease)     no meds required  . History of bronchitis     early 2014  . History of bladder infections     last time about a yr ago  . Depression     takes Wellbutrin daily  . History of shingles     a yr ago    Past Surgical History  Procedure Laterality Date  . Tooth extraction      wisdom  . Tonsillectomy      as a child  . Back surgery      scoliosis;Harrington Rods in place  . Tubal ligation  1991  . Esophagogastroduodenoscopy    . Anterior cervical decomp/discectomy fusion N/A 03/19/2013    Procedure: ANTERIOR CERVICAL DECOMPRESSION/DISCECTOMY FUSION 3 LEVELS;  Surgeon: Otilio Connors, MD;  Location: [facility];  Service: Neurosurgery;  Laterality: N/A;  Cervical Five-Six,Cervical Six-Seven,Cervical Seven-Thoracic One  Anterior cervical  decompression/diskectomy/fusion/allograft/trestle plate    There are no active problems to display for this patient.   ROS   Patient denies fever, chills, headache, sweats, rash, change in vision, change in hearing, chest pain, cough, nausea or vomiting, urinary symptoms. All other systems are reviewed and are negative.  PHYSICAL EXAM  Patient is oriented to person time and place. Affect is normal. There is no jugulovenous distention. Lungs are clear. Respiratory effort is nonlabored. Head is atraumatic. Sclera and conjunctiva are normal. I'm not sure if the abnormalities on her eyelid his xanthelasma or not. Cardiac exam reveals S1 and S2. The abdomen is soft. There is no peripheral edema. There are no musculoskeletal deformities. There are no skin rashes.  Filed Vitals:   02/19/14 1628  BP: 110/66  Pulse: 93  Height: 5' 3.5" (1.613 m)  Weight: 178 lb (80.74 kg)    EKG is done today and reviewed by me. There is sinus rhythm. There is incomplete right bundle branch block. There are nonspecific ST-T wave abnormalities. ASSESSMENT & PLAN

## 2014-02-19 NOTE — Assessment & Plan Note (Addendum)
The patient has lost some weight in her shortness of breath is somewhat better. I will await the studies that have been done before I can assess this any further.  I spent an extensive amount of time today trying to be sure that I have appropriate information. I've also talk with the patient at length to be sure that she does not need any urgent further workup at this point. She is stable. I will recontact her when I have more information from her outside records.

## 2014-02-19 NOTE — Assessment & Plan Note (Signed)
The patient's lipids were abnormal. Additionally her total cholesterol was 218 with an LDL of 148. HDL was 62. Triglycerides were 170. She tried to take Crestor but felt "foggy. Currently she is on a form of fish oil. No change at this time.  I will be obtaining the recent cardiac tests that she had through [facility]. I will review all this information and then contact the patient.

## 2014-02-19 NOTE — Assessment & Plan Note (Signed)
The abnormalities on her EKG are nonspecific at this point. I will followup the tests that she has had done for further evaluation.

## 2014-02-19 NOTE — Patient Instructions (Addendum)
**Note De-Identified Charlotte Castillo Obfuscation** Your physician recommends that you continue on your current medications as directed. Please refer to the Current Medication list given to you today.  We will contact you once we have received and reviewed your cardiac records.

## 2014-02-20 ENCOUNTER — Telehealth: Payer: Self-pay | Admitting: Cardiology

## 2014-02-20 NOTE — Telephone Encounter (Signed)
I called [facility] To Obtain Records On A referral From Dr.James Little's Office and Spoke With Ebony asked Her to [employer] and she Stated I didn't Have to Call she has had Several Calls about this already.  She faxed the Holter Report, I called back and asked her to Fax the Echo & Stress and as of 3:50pm I have Not Received these Records, Wonda Horner called Several Times yesterday also to Get these records also and Was Unsuccessful. Wonda Horner called back again Today had to [employer] on [address] asking If Someone could Please fax the Records if their available if their not to call Farley Ly in Medical records and let us Know. Wonda Horner is not back in the Office Until Tuesday 6.23. I let Jeani Hawking know if I receive these Records i will Hold onto them until her Return into The Office. 6.18.15/km

## 2014-03-13 ENCOUNTER — Encounter: Payer: Self-pay | Admitting: Cardiology

## 2014-03-13 ENCOUNTER — Telehealth: Payer: Self-pay

## 2014-03-13 DIAGNOSIS — R9430 Abnormal result of cardiovascular function study, unspecified: Secondary | ICD-10-CM

## 2014-03-13 NOTE — Progress Notes (Unsigned)
Patient ID: Charlotte Castillo, female   DOB: 1965/09/12, 48 y.o.   MRN: [id number] I received a Holter monitor report from [facility]. This appears to be a 48 hour monitor. There was no ventricular ectopy. There were no pauses. There were rare PACs. No significant abnormalities were found.  I received a treadmill stress test from [facility] at [facility]. This is dated March, 2015 patient exercised into stage III. Peak heart rate was 172 representing 99% predicted maximal heart rate. There was no chest pain. There was no evidence of ischemia. Overall exercise was 9.5 mets. There was a normal blood pressure response. There was no evidence of ischemia.  I received an echo report from [facility] dated March, 2015. There was normal left ventricular function. Ejection fraction was 55-60%.

## 2014-03-13 NOTE — Progress Notes (Signed)
Patient ID: Charlotte Castillo, female   DOB: 10/27/1965, 48 y.o.   MRN: [id number] I saw the patient recently for complete evaluation. At that time I became aware that she had a prior cardiology workup. We have obtain the information from [facility] in [facility]. Echo showed excellent LV function. Stress test was normal. Holter monitor revealed scattered PACs. With this information, she does not need any further cardiac workup. We will be calling her to let her know.  Daryel November, MD

## 2014-03-13 NOTE — Telephone Encounter (Signed)
**Note De-Identified Khrystina Bonnes Obfuscation** Patient ID: Charlotte Castillo, female DOB: 1966/06/13, 48 y.o. MRN: [id number]  I saw the patient recently for complete evaluation. At that time I became aware that she had a prior cardiology workup. We have obtain the information from [facility] in [facility]. Echo showed excellent LV function. Stress test was normal. Holter monitor revealed scattered PACs. With this information, she does not need any further cardiac workup. We will be calling her to let her know.  Daryel November, MD    I left a detailed message on the pts VM stating all of the above. I also left our office phone # on VM in case she has any questions.

## 2014-04-17 ENCOUNTER — Ambulatory Visit: Payer: PRIVATE HEALTH INSURANCE | Admitting: Dietician

## 2014-05-27 ENCOUNTER — Ambulatory Visit: Payer: PRIVATE HEALTH INSURANCE | Admitting: Dietician

## 2014-07-07 ENCOUNTER — Encounter: Payer: Self-pay | Admitting: Cardiology

## 2015-04-01 IMAGING — DX DG CERVICAL SPINE 1V
1 series · 1 of 1 positions shown · non-contrast
Comparison: MRI 02/18/2013.

CLINICAL DATA: Neck pain.

DG CERVICAL SPINE - 1 VIEW

[lat]
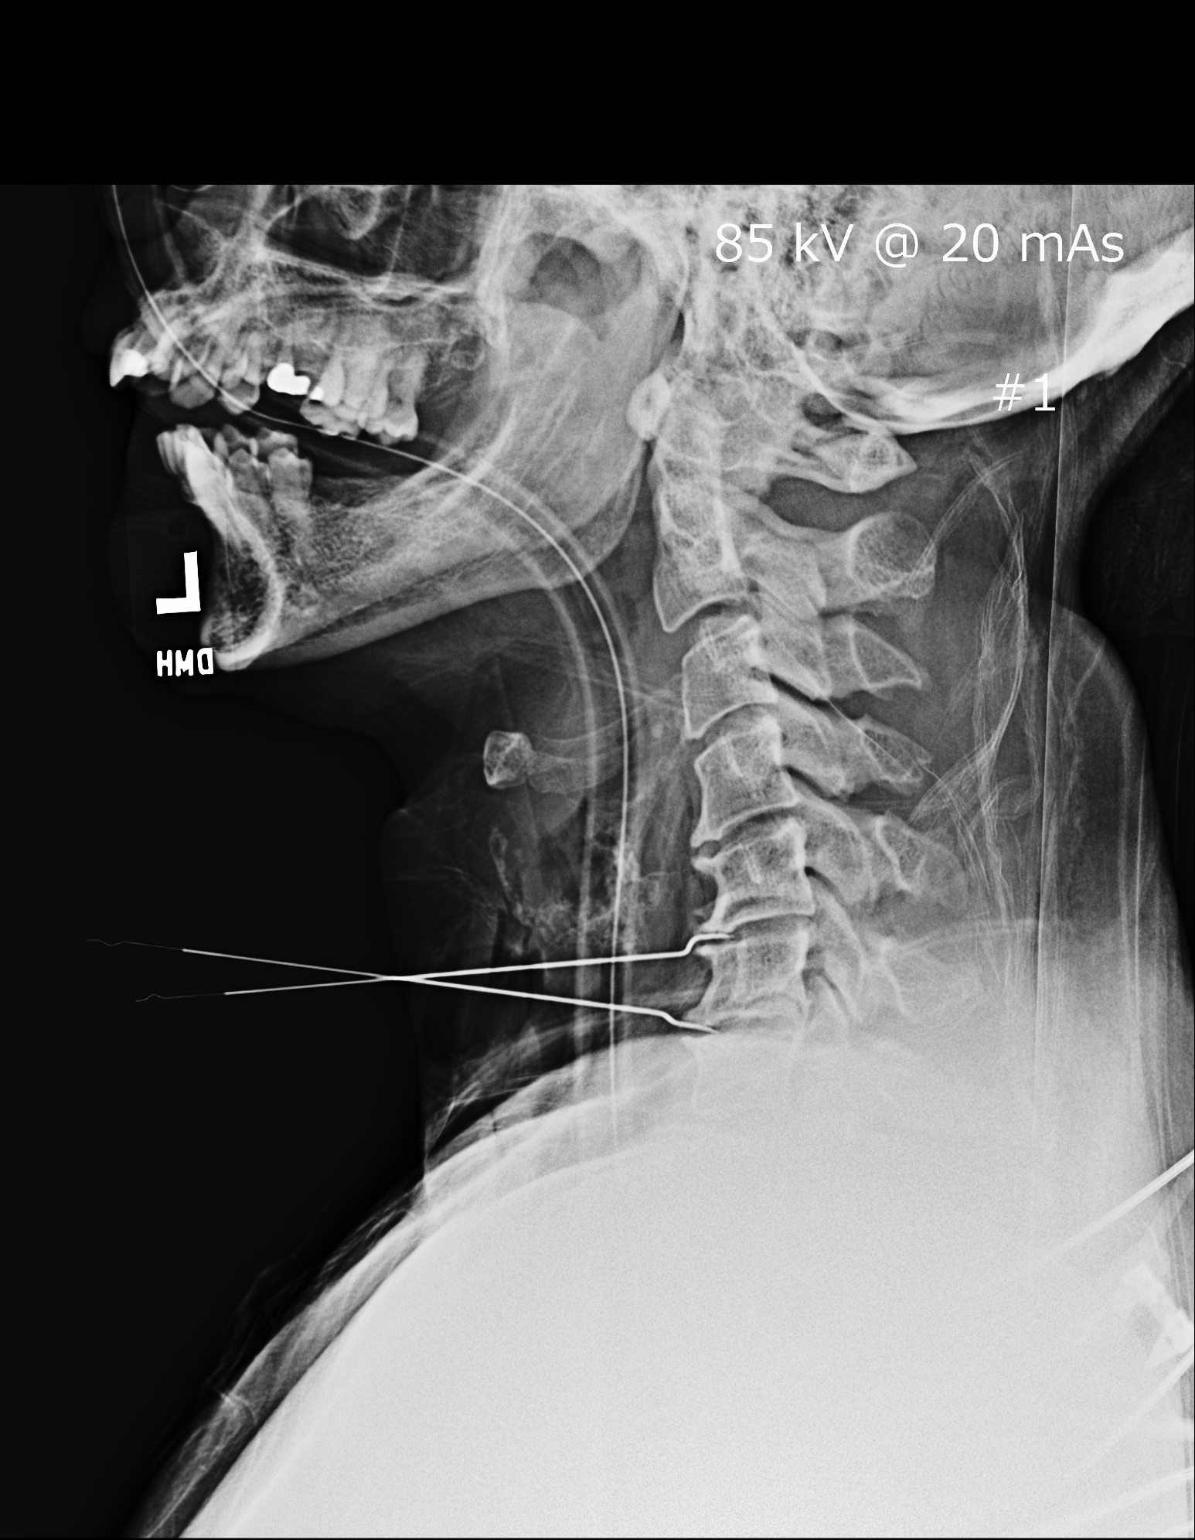

[1 of 1 positions shown; findings below may reference images not displayed]

FINDINGS: Intraoperative lateral film demonstrates needles placed
in the C5-6 and C6-7 interspaces.
IMPRESSION: As above.

## 2015-06-25 ENCOUNTER — Other Ambulatory Visit: Payer: Self-pay

## 2015-08-20 ENCOUNTER — Other Ambulatory Visit: Payer: Self-pay | Admitting: Pain Medicine
